# Patient Record
Sex: Female | Born: 1980 | Race: White | Hispanic: No | Smoking: Never smoker
Health system: Southern US, Community
[De-identification: ages and names within clinical notes are randomized; demographics above are authoritative.]

## PROBLEM LIST (undated history)

## (undated) DIAGNOSIS — I456 Pre-excitation syndrome: Secondary | ICD-10-CM

## (undated) DIAGNOSIS — D649 Anemia, unspecified: Secondary | ICD-10-CM

## (undated) HISTORY — PX: ABLATION: SHX5711

---

## 2015-06-22 ENCOUNTER — Encounter (HOSPITAL_COMMUNITY): Payer: Self-pay | Admitting: Emergency Medicine

## 2015-06-22 ENCOUNTER — Emergency Department (HOSPITAL_COMMUNITY): Payer: Self-pay

## 2015-06-22 ENCOUNTER — Emergency Department (HOSPITAL_COMMUNITY)
Admission: EM | Admit: 2015-06-22 | Discharge: 2015-06-22 | Disposition: A | Discharge status: Home / Self Care | Payer: Self-pay | Attending: Emergency Medicine | Admitting: Emergency Medicine

## 2015-06-22 DIAGNOSIS — R0600 Dyspnea, unspecified: Secondary | ICD-10-CM | POA: Insufficient documentation

## 2015-06-22 DIAGNOSIS — Z8669 Personal history of other diseases of the nervous system and sense organs: Secondary | ICD-10-CM | POA: Insufficient documentation

## 2015-06-22 DIAGNOSIS — Z862 Personal history of diseases of the blood and blood-forming organs and certain disorders involving the immune mechanism: Secondary | ICD-10-CM | POA: Insufficient documentation

## 2015-06-22 DIAGNOSIS — R5383 Other fatigue: Secondary | ICD-10-CM | POA: Insufficient documentation

## 2015-06-22 DIAGNOSIS — R1033 Periumbilical pain: Secondary | ICD-10-CM | POA: Insufficient documentation

## 2015-06-22 DIAGNOSIS — R1013 Epigastric pain: Secondary | ICD-10-CM | POA: Insufficient documentation

## 2015-06-22 DIAGNOSIS — N738 Other specified female pelvic inflammatory diseases: Secondary | ICD-10-CM | POA: Insufficient documentation

## 2015-06-22 HISTORY — DX: Pre-excitation syndrome: I45.6

## 2015-06-22 HISTORY — DX: Anemia, unspecified: D64.9

## 2015-06-22 LAB — BASIC METABOLIC PANEL
ANION GAP: 10 (ref 5–15)
BUN: 18 mg/dL (ref 6–20)
CALCIUM: 9.5 mg/dL (ref 8.9–10.3)
CHLORIDE: 106 mmol/L (ref 101–111)
CO2: 25 mmol/L (ref 22–32)
Creatinine, Ser: 0.75 mg/dL (ref 0.44–1.00)
GFR calc non Af Amer: >60 mL/min (ref >60)
Glucose, Bld: 71 mg/dL (ref 65–99)
Potassium: 4 mmol/L (ref 3.5–5.1)
Sodium: 141 mmol/L (ref 135–145)

## 2015-06-22 LAB — CBC
HCT: 38 % (ref 36.0–46.0)
HEMOGLOBIN: 12.6 g/dL (ref 12.0–15.0)
MCH: 29.6 pg (ref 26.0–34.0)
MCHC: 33.2 g/dL (ref 30.0–36.0)
MCV: 89.4 fL (ref 78.0–100.0)
Platelets: 168 10*3/uL (ref 150–400)
RBC: 4.25 MIL/uL (ref 3.87–5.11)
RDW: 14.1 % (ref 11.5–15.5)
WBC: 8 10*3/uL (ref 4.0–10.5)

## 2015-06-22 LAB — HEPATIC FUNCTION PANEL
ALBUMIN: 4 g/dL (ref 3.5–5.0)
ALT: 18 U/L (ref 14–54)
AST: 25 U/L (ref 15–41)
Alkaline Phosphatase: 45 U/L (ref 38–126)
Bilirubin, Direct: <0.1 mg/dL — ABNORMAL LOW (ref 0.1–0.5)
TOTAL PROTEIN: 7.1 g/dL (ref 6.5–8.1)
Total Bilirubin: 0.3 mg/dL (ref 0.3–1.2)

## 2015-06-22 LAB — LIPASE, BLOOD: LIPASE: 44 U/L (ref 11–51)

## 2015-06-22 LAB — I-STAT TROPONIN, ED: TROPONIN I, POC: 0 ng/mL (ref 0.00–0.08)

## 2015-06-22 MED ORDER — FAMOTIDINE 20 MG PO TABS: 20.0000 mg | ORAL_TABLET | Freq: Two times a day (BID) | ORAL | Status: AC

## 2015-06-22 MED ORDER — FAMOTIDINE 20 MG PO TABS
40.0000 mg | ORAL_TABLET | Freq: Once | ORAL | Status: AC
Start: 2015-06-22 — End: 2015-06-22
  Administered 2015-06-22: 40 mg via ORAL
  Filled 2015-06-22: qty 2

## 2015-06-22 MED ORDER — SUCRALFATE 1 G PO TABS: 1.0000 g | ORAL_TABLET | Freq: Four times a day (QID) | ORAL | Status: AC

## 2015-06-22 MED ORDER — SUCRALFATE 1 G PO TABS
1.0000 g | ORAL_TABLET | Freq: Once | ORAL | Status: AC
  Administered 2015-06-22: 1 g via ORAL
  Filled 2015-06-22: qty 1

## 2015-06-22 MED ORDER — ONDANSETRON HCL 4 MG/2ML IJ SOLN
4.0000 mg | Freq: Once | INTRAMUSCULAR | Status: AC
  Administered 2015-06-22: 4 mg via INTRAVENOUS
  Filled 2015-06-22: qty 2

## 2015-06-22 MED ORDER — GI COCKTAIL ~~LOC~~
30.0000 mL | Freq: Once | ORAL | Status: AC
  Administered 2015-06-22: 30 mL via ORAL
  Filled 2015-06-22: qty 30

## 2015-06-22 NOTE — ED Notes (Addendum)
Pt reports sudden onset N, fatigue, back pain, SOB with heaviness to central chest.  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  Pt denies recent illness.  EMS reports giving 324 ASA, pt reports pain improved after ASA.  Pain 5/10

## 2015-06-22 NOTE — ED Provider Notes (Signed)
CSN: [id number]     Arrival date & time 06/22/15  0907 History   First MD Initiated Contact with Patient 06/22/15 [phone]     Chief Complaint  Patient presents with  . Chest Pain     (Consider location/radiation/quality/duration/timing/severity/associated sxs/prior Treatment) HPI Comments: Patient here complaining of sudden onset of epigastric pain as well as the pubic discomfort which began while driving to work. Described as a crampy sensation was then radiates to her chest and was not associated with diaphoresis but did have some dyspnea. Has some associated fatigue. Denies any syncope or nursing a baby.. Denies any urinary symptoms. No vaginal bleeding or discharge. Denies any recent cough or congestion. Patient called EMS was given aspirin which did improve her symptoms. She was transported here. Denies any prior history of same. Does have a remote history of cardiac ablation 19 years ago for WPW  Patient is a 35 y.o. female presenting with chest pain. The history is provided by the patient.  Chest Pain   Past Medical History  Diagnosis Date  . Wolff-Parkinson-White syndrome   . Anemia    Past Surgical History  Procedure Laterality Date  . Ablation    . Cesarean section     No family history on file. Social History  Substance Use Topics  . Smoking status: Never Smoker   . Smokeless tobacco: Never Used  . Alcohol Use: No   OB History    No data available     Review of Systems  Cardiovascular: Positive for chest pain.  All other systems reviewed and are negative.     Allergies  Demerol and Morphine and related  Home Medications   Prior to Admission medications   Not on File   BP 110/68 mmHg  Pulse 78  Temp(Src) 97.6 F (36.4 C) (Oral)  Resp 20  Ht  (1.6 m)  Wt 61.236 kg  BMI 23.92 kg/m2  SpO2 100%  LMP 06/01/2015 (Approximate) Physical Exam  Constitutional: She is oriented to person, place, and time. She appears well-developed and well-nourished.   Non-toxic appearance. No distress.  HENT:  Head: Normocephalic and atraumatic.  Eyes: Conjunctivae, EOM and lids are normal. Pupils are equal, round, and reactive to light.  Neck: Normal range of motion. Neck supple. No tracheal deviation present. No thyroid mass present.  Cardiovascular: Normal rate, regular rhythm and normal heart sounds.  Exam reveals no gallop.   No murmur heard. Pulmonary/Chest: Effort normal and breath sounds normal. No stridor. No respiratory distress. She has no decreased breath sounds. She has no wheezes. She has no rhonchi. She has no rales.  Abdominal: Soft. Normal appearance and bowel sounds are normal. She exhibits no distension. There is tenderness in the epigastric area and periumbilical area. There is no rebound and no CVA tenderness.    Musculoskeletal: Normal range of motion. She exhibits no edema or tenderness.  Neurological: She is alert and oriented to person, place, and time. She has normal strength. No cranial nerve deficit or sensory deficit. GCS eye subscore is 4. GCS verbal subscore is 5. GCS motor subscore is 6.  Skin: Skin is warm and dry. No abrasion and no rash noted.  Psychiatric: She has a normal mood and affect. Her speech is normal and behavior is normal.  Nursing note and vitals reviewed.   ED Course  Procedures (including critical care time) Labs Review Labs Reviewed  BASIC METABOLIC PANEL  CBC  I-STAT TROPOININ, ED    Imaging Review No results found. I  have personally reviewed and evaluated these images and lab results as part of my medical decision-making.   EKG Interpretation None      MDM   Final diagnoses:  None    ED ECG REPORT   Date: 06/22/2015  Rate: 60  Rhythm: normal sinus rhythm  QRS Axis: normal  Intervals: normal  ST/T Wave abnormalities: normal  Conduction Disutrbances:none  Narrative Interpretation:   Old EKG Reviewed: none available  I have personally reviewed the EKG tracing and agree with the  computerized printout as noted.   Patient given medications for reflux and feels better. Will place on Carafate and Pepcid and discharged home  Lorre Nick, MD 06/22/15 1322

## 2015-06-22 NOTE — ED Notes (Signed)
Pt states "I have no energy -- feels like a tightness in my chest that I can't get a big breath"

## 2015-06-22 NOTE — ED Notes (Signed)
Pt states she went to zumba this am, had no difficulty, went to [place] kids this weekend and felt fine after. States that it feels heavy on chest.

## 2015-06-22 NOTE — Discharge Instructions (Signed)
Abdominal Pain, Adult °Many things can cause abdominal pain. Usually, abdominal pain is not caused by a disease and will improve without treatment. It can often be observed and treated at home. Your health care provider will do a physical exam and possibly order blood tests and X-rays to help determine the seriousness of your pain. However, in many cases, more time must pass before a clear cause of the pain can be found. Before that point, your health care provider may not know if you need more testing or further treatment. °HOME CARE INSTRUCTIONS °Monitor your abdominal pain for any changes. The following actions may help to alleviate any discomfort you are experiencing: °· Only take over-the-counter or prescription medicines as directed by your health care provider. °· Do not take laxatives unless directed to do so by your health care provider. °· Try a clear liquid diet (broth, tea, or water) as directed by your health care provider. Slowly move to a bland diet as tolerated. °SEEK MEDICAL CARE IF: °· You have unexplained abdominal pain. °· You have abdominal pain associated with nausea or diarrhea. °· You have pain when you urinate or have a bowel movement. °· You experience abdominal pain that wakes you in the night. °· You have abdominal pain that is worsened or improved by eating food. °· You have abdominal pain that is worsened with eating fatty foods. °· You have a fever. °SEEK IMMEDIATE MEDICAL CARE IF: °· Your pain does not go away within 2 hours. °· You keep throwing up (vomiting). °· Your pain is felt only in portions of the abdomen, such as the right side or the left lower portion of the abdomen. °· You pass bloody or black tarry stools. °MAKE SURE YOU: °· Understand these instructions. °· Will watch your condition. °· Will get help right away if you are not doing well or get worse. °  °This information is not intended to replace advice given to you by your health care provider. Make sure you discuss  any questions you have with your health care provider. °  °Document Released: 01/26/2005 Document Revised: 01/07/2015 Document Reviewed: 12/26/2012 °Elsevier Interactive Patient Education ©2016 [employer] °Food Choices for Gastroesophageal Reflux Disease, Adult °When you have gastroesophageal reflux disease (GERD), the foods you eat and your eating habits are very important. Choosing the right foods can help ease the discomfort of GERD. °WHAT GENERAL GUIDELINES DO I NEED TO FOLLOW? °· Choose fruits, vegetables, whole grains, low-fat dairy products, and low-fat meat, fish, and poultry. °· Limit fats such as oils, salad dressings, butter, nuts, and avocado. °· Keep a food diary to identify foods that cause symptoms. °· Avoid foods that cause reflux. These may be different for different people. °· Eat frequent small meals instead of three large meals each day. °· Eat your meals slowly, in a relaxed setting. °· Limit fried foods. °· Cook foods using methods other than frying. °· Avoid drinking alcohol. °· Avoid drinking large amounts of liquids with your meals. °· Avoid bending over or lying down until 2-3 hours after eating. °WHAT FOODS ARE NOT RECOMMENDED? °The following are some foods and drinks that may worsen your symptoms: °Vegetables °Tomatoes. Tomato juice. Tomato and spaghetti sauce. Chili peppers. Onion and garlic. Horseradish. °Fruits °Oranges, grapefruit, and lemon (fruit and juice). °Meats °High-fat meats, fish, and poultry. This includes hot dogs, ribs, ham, sausage, salami, and bacon. °Dairy °Whole milk and chocolate milk. Sour cream. Cream. Butter. Ice cream. Cream cheese.  °Beverages °Coffee and tea, with   or without caffeine. Carbonated beverages or energy drinks. °Condiments °Hot sauce. Barbecue sauce.  °Sweets/Desserts °Chocolate and cocoa. Donuts. Peppermint and spearmint. °Fats and Oils °High-fat foods, including French fries and potato chips. °Other °Vinegar. Strong spices, such as black pepper,  white pepper, red pepper, cayenne, curry powder, cloves, ginger, and chili powder. °The items listed above may not be a complete list of foods and beverages to avoid. Contact your dietitian for more information. °  °This information is not intended to replace advice given to you by your health care provider. Make sure you discuss any questions you have with your health care provider. °  °Document Released: 04/18/2005 Document Revised: 05/09/2014 Document Reviewed: 02/20/2013 °Elsevier Interactive Patient Education ©2016 [employer]vier Inc. ° °

## 2018-11-01 ENCOUNTER — Encounter (HOSPITAL_COMMUNITY): Payer: Self-pay | Admitting: Emergency Medicine

## 2018-11-01 ENCOUNTER — Other Ambulatory Visit: Payer: Self-pay

## 2018-11-01 DIAGNOSIS — Y9389 Activity, other specified: Secondary | ICD-10-CM | POA: Diagnosis not present

## 2018-11-01 DIAGNOSIS — Y999 Unspecified external cause status: Secondary | ICD-10-CM | POA: Diagnosis not present

## 2018-11-01 DIAGNOSIS — T192XXA Foreign body in vulva and vagina, initial encounter: Secondary | ICD-10-CM | POA: Diagnosis not present

## 2018-11-01 DIAGNOSIS — Y9289 Other specified places as the place of occurrence of the external cause: Secondary | ICD-10-CM | POA: Diagnosis not present

## 2018-11-01 DIAGNOSIS — Y658 Other specified misadventures during surgical and medical care: Secondary | ICD-10-CM | POA: Diagnosis not present

## 2018-11-01 DIAGNOSIS — Z5321 Procedure and treatment not carried out due to patient leaving prior to being seen by health care provider: Secondary | ICD-10-CM | POA: Insufficient documentation

## 2018-11-01 DIAGNOSIS — G8918 Other acute postprocedural pain: Secondary | ICD-10-CM | POA: Diagnosis not present

## 2018-11-01 NOTE — ED Triage Notes (Signed)
Yesterday the patient had an IUD removed. Today she experienced heavy cramping. Tylenol and Ibuprofen did not help. Patient believes she found part of her IUD in the toilet today.

## 2018-11-02 ENCOUNTER — Emergency Department (HOSPITAL_COMMUNITY)
Admission: EM | Admit: 2018-11-02 | Discharge: 2018-11-02 | Discharge status: Against Medical Advice | Payer: Medicaid Other | Source: Home / Self Care

## 2018-11-02 ENCOUNTER — Other Ambulatory Visit: Payer: Self-pay

## 2018-11-02 ENCOUNTER — Encounter (HOSPITAL_COMMUNITY): Payer: Self-pay

## 2018-11-02 ENCOUNTER — Emergency Department (HOSPITAL_COMMUNITY)
Admission: EM | Admit: 2018-11-02 | Discharge: 2018-11-02 | Disposition: A | Discharge status: Home / Self Care | Payer: Medicaid Other | Attending: Emergency Medicine | Admitting: Emergency Medicine

## 2018-11-02 DIAGNOSIS — Y658 Other specified misadventures during surgical and medical care: Secondary | ICD-10-CM | POA: Insufficient documentation

## 2018-11-02 DIAGNOSIS — Y9389 Activity, other specified: Secondary | ICD-10-CM | POA: Insufficient documentation

## 2018-11-02 DIAGNOSIS — T192XXA Foreign body in vulva and vagina, initial encounter: Secondary | ICD-10-CM

## 2018-11-02 DIAGNOSIS — Y9289 Other specified places as the place of occurrence of the external cause: Secondary | ICD-10-CM | POA: Insufficient documentation

## 2018-11-02 DIAGNOSIS — Y999 Unspecified external cause status: Secondary | ICD-10-CM | POA: Insufficient documentation

## 2018-11-02 DIAGNOSIS — G8918 Other acute postprocedural pain: Secondary | ICD-10-CM | POA: Insufficient documentation

## 2018-11-02 LAB — BASIC METABOLIC PANEL
Anion gap: 8 (ref 5–15)
BUN: 12 mg/dL (ref 6–20)
CO2: 28 mmol/L (ref 22–32)
Calcium: 9.2 mg/dL (ref 8.9–10.3)
Chloride: 104 mmol/L (ref 98–111)
Creatinine, Ser: 0.73 mg/dL (ref 0.44–1.00)
GFR calc Af Amer: >60 mL/min (ref >60)
GFR calc non Af Amer: >60 mL/min (ref >60)
Glucose, Bld: 95 mg/dL (ref 70–99)
Potassium: 4.4 mmol/L (ref 3.5–5.1)
Sodium: 140 mmol/L (ref 135–145)

## 2018-11-02 LAB — CBC WITH DIFFERENTIAL/PLATELET
Abs Immature Granulocytes: 0.02 10*3/uL (ref 0.00–0.07)
Basophils Absolute: 0 10*3/uL (ref 0.0–0.1)
Basophils Relative: 0 %
Eosinophils Absolute: 0.3 10*3/uL (ref 0.0–0.5)
Eosinophils Relative: 4 %
HCT: 38.5 % (ref 36.0–46.0)
Hemoglobin: 12.2 g/dL (ref 12.0–15.0)
Immature Granulocytes: 0 %
Lymphocytes Relative: 31 %
Lymphs Abs: 2.1 10*3/uL (ref 0.7–4.0)
MCH: 29.5 pg (ref 26.0–34.0)
MCHC: 31.7 g/dL (ref 30.0–36.0)
MCV: 93.2 fL (ref 80.0–100.0)
Monocytes Absolute: 0.5 10*3/uL (ref 0.1–1.0)
Monocytes Relative: 7 %
Neutro Abs: 3.9 10*3/uL (ref 1.7–7.7)
Neutrophils Relative %: 58 %
Platelets: 202 10*3/uL (ref 150–400)
RBC: 4.13 MIL/uL (ref 3.87–5.11)
RDW: 13.2 % (ref 11.5–15.5)
WBC: 6.7 10*3/uL (ref 4.0–10.5)
nRBC: 0 % (ref 0.0–0.2)

## 2018-11-02 LAB — WET PREP, GENITAL
Sperm: NONE SEEN
Trich, Wet Prep: NONE SEEN
Yeast Wet Prep HPF POC: NONE SEEN

## 2018-11-02 NOTE — Discharge Instructions (Signed)
Continue treating your pain as directed by your gynecologist. Schedule an appointment to follow-up with them on Monday. Return to the ER if you develop fever, significant vaginal bleeding, or new or concerning symptoms.

## 2018-11-02 NOTE — ED Notes (Signed)
Bed: WA13 Expected date:  Expected time:  Means of arrival:  Comments: triage 

## 2018-11-02 NOTE — ED Notes (Signed)
Pt ambulatory to restroom

## 2018-11-02 NOTE — ED Triage Notes (Signed)
Patient had her IUD removed 2 days ago. Patient reports that she passed a piece of plastic from her vagina last night.  Patient also c/o diarrhea x 4-5 times today.

## 2018-11-02 NOTE — ED Provider Notes (Signed)
[facility]-EMERGENCY DEPT Provider Note   CSN: [id number] Arrival date & time: 11/02/18  1110    History   Chief Complaint Chief Complaint  Patient presents with  . foreign object from vagina    HPI Gwendolyn Price is a 38 y.o. female w PMHx WPW, recent hysteroscopy for copper IUD removal and polypectomy,  presenting to the ED with complaint of abdominal cramping. Pt had procedure done on Wednesday by [facility], for imbedded IUD. Yesterday she developed menstrual-like cramping. Sx improved with tylenol today. She states she also passed a piece of plastic from her vagina, and presents it on evaluation. She is concerned for retained foreign bodies in her vagina. She has had some vaginal bleeding, though not more than a period. This morning she developed diarrhea. Per chart review, she called her Gyn clinic yesterday who recommended she save the piece of plastic and f/u outpatient on 7/6. She denies assoc n/v, urinary sx, fever, or other assoc symptoms.     The history is provided by the patient and medical records.    Past Medical History:  Diagnosis Date  . Anemia   . Wolff-Parkinson-White syndrome     There are no active problems to display for this patient.   Past Surgical History:  Procedure Laterality Date  . ABLATION    . CESAREAN SECTION       OB History   No obstetric history on file.      Home Medications    Prior to Admission medications   Medication Sig Start Date End Date Taking? Authorizing Provider  acetaminophen (TYLENOL) 500 MG tablet Take 500 mg by mouth every 6 (six) hours as needed for mild pain.   Yes [provider]  ibuprofen (ADVIL) 800 MG tablet Take 800 mg by mouth every 8 (eight) hours as needed for mild pain.   Yes [provider]  famotidine (PEPCID) 20 MG tablet Take 1 tablet (20 mg total) by mouth 2 (two) times daily. Patient not taking: Reported on 11/02/2018 06/22/15   Lorre NickAllen, Anthony, MD  sucralfate  (CARAFATE) 1 g tablet Take 1 tablet (1 g total) by mouth 4 (four) times daily. Patient not taking: Reported on 11/02/2018 06/22/15   Lorre NickAllen, Anthony, MD    Family History Family History  Problem Relation Age of Onset  . Rheum arthritis Mother   . Cancer Mother   . Hypertension Father   . High Cholesterol Father     Social History Social History   Tobacco Use  . Smoking status: Never Smoker  . Smokeless tobacco: Never Used  Substance Use Topics  . Alcohol use: No  . Drug use: No     Allergies   Demerol [meperidine] and Morphine and related   Review of Systems Review of Systems  Constitutional: Negative for fever.  Gastrointestinal: Positive for abdominal pain. Negative for nausea and vomiting.  Genitourinary: Positive for vaginal bleeding.  All other systems reviewed and are negative.    Physical Exam Updated Vital Signs BP 108/71   Pulse 71   Temp 98.4 F (36.9 C) (Oral)   Resp 16   Ht 5\' 3"  (1.6 m)   Wt 76 kg   LMP 11/02/2018   SpO2 100%   BMI 29.70 kg/m   Physical Exam Vitals signs and nursing note reviewed. Exam conducted with a chaperone present.  Constitutional:      General: She is not in acute distress.    Appearance: She is well-developed. She is not ill-appearing.  HENT:     Head: Normocephalic and atraumatic.  Eyes:     Conjunctiva/sclera: Conjunctivae normal.  Cardiovascular:     Rate and Rhythm: Normal rate and regular rhythm.  Pulmonary:     Effort: Pulmonary effort is normal. No respiratory distress.     Breath sounds: Normal breath sounds.  Abdominal:     General: Bowel sounds are normal.     Palpations: Abdomen is soft.     Tenderness: There is abdominal tenderness in the suprapubic area. There is no guarding or rebound.  Genitourinary:    Labia:        Right: No rash or tenderness.        Left: No rash or tenderness.      Cervix: Cervical bleeding present.     Comments: There is mild amount of dark red blood present from the  cervical os, no pus or abnormal discharge.  The uterus does feel somewhat firm with some tenderness, however no bogginess.  No foreign bodies visualized or palpated Skin:    General: Skin is warm.  Neurological:     Mental Status: She is alert.  Psychiatric:        Behavior: Behavior normal.      ED Treatments / Results  Labs (all labs ordered are listed, but only abnormal results are displayed) Labs Reviewed  WET PREP, GENITAL - Abnormal; Notable for the following components:      Result Value   Clue Cells Wet Prep HPF POC PRESENT (*)    WBC, Wet Prep HPF POC FEW (*)    All other components within normal limits  CBC WITH DIFFERENTIAL/PLATELET  BASIC METABOLIC PANEL    EKG None  Radiology No results found.  Procedures Procedures (including critical care time)  Medications Ordered in ED Medications - No data to display   Initial Impression / Assessment and Plan / ED Course  I have reviewed the triage vital signs and the nursing notes.  Pertinent labs & imaging results that were available during my care of the patient were reviewed by me and considered in my medical decision making (see chart for details).       Patient presenting with concern for foreign object that came out of her vagina yesterday after a hysteroscopy for movable of embedded IUD on Wednesday at [facility] as well as polypectomy.  She has been having some pelvic cramping and small amount of vaginal bleeding, however is most concerned about the piece of plastic that came out of her vagina.  She called her gynecologist office who performed the procedure and reported no concerns otherwise to follow-up on Monday and to bring the piece of plastic with her.  She is is concerned for retained foreign body.  No fever, nausea, vomiting, or other associated symptoms.  On pelvic exam there is no purulence noted.  There is some tenderness with palpation of the uterus. No obvious retained foreign bodies on exam. Wet prep  with few white cells.  CBC with normal white count, normal metabolic panel.  Patient is afebrile with normal vital signs.  Low suspicion for postop infection.  Discussed with Dr. Lockie Molauratolo.  At this time we recommend discharge with outpatient follow-up with gynecology.  Continue treating symptoms.  Return for fever or significant vaginal bleeding.  Patient agreeable to plan and safe for discharge.  Discussed results, findings, treatment and follow up. Patient advised of return precautions. Patient verbalized understanding and agreed with plan.  Final Clinical Impressions(s) / ED Diagnoses  Final diagnoses:  Post-operative pain  Foreign body in vagina, initial encounter    ED Discharge Orders    None       , [place] N, PA-C 11/02/18 [place], [place], DO 11/02/18 1903

## 2018-11-02 NOTE — ED Notes (Signed)
Pt refused d/c vitals.

## 2019-05-01 ENCOUNTER — Ambulatory Visit: Payer: Medicaid Other | Attending: Internal Medicine

## 2019-05-01 DIAGNOSIS — Z20822 Contact with and (suspected) exposure to covid-19: Secondary | ICD-10-CM

## 2019-05-02 LAB — NOVEL CORONAVIRUS, NAA: SARS-CoV-2, NAA: NOT DETECTED

## 2020-11-11 ENCOUNTER — Other Ambulatory Visit: Payer: Self-pay | Admitting: Physician Assistant

## 2020-11-11 DIAGNOSIS — Z1231 Encounter for screening mammogram for malignant neoplasm of breast: Secondary | ICD-10-CM

## 2020-11-18 ENCOUNTER — Other Ambulatory Visit: Payer: Self-pay

## 2020-11-18 ENCOUNTER — Ambulatory Visit
Admission: RE | Admit: 2020-11-18 | Discharge: 2020-11-18 | Disposition: A | Discharge status: Home / Self Care | Payer: Medicaid Other | Source: Ambulatory Visit | Attending: Physician Assistant | Admitting: Physician Assistant

## 2020-11-18 DIAGNOSIS — Z1231 Encounter for screening mammogram for malignant neoplasm of breast: Secondary | ICD-10-CM

## 2022-05-07 IMAGING — MG MM DIGITAL SCREENING BILAT W/ TOMO AND CAD
8 series · 9 of 24 positions shown · non-contrast
Comparison: Previous exam(s).

CLINICAL DATA: Screening.

EXAM:
DIGITAL SCREENING BILATERAL MAMMOGRAM WITH TOMOSYNTHESIS AND CAD
TECHNIQUE: Bilateral screening digital craniocaudal and mediolateral oblique
mammograms were obtained. Bilateral screening digital breast
tomosynthesis was performed. The images were evaluated with
computer-aided detection.

[R CC synth-2D]
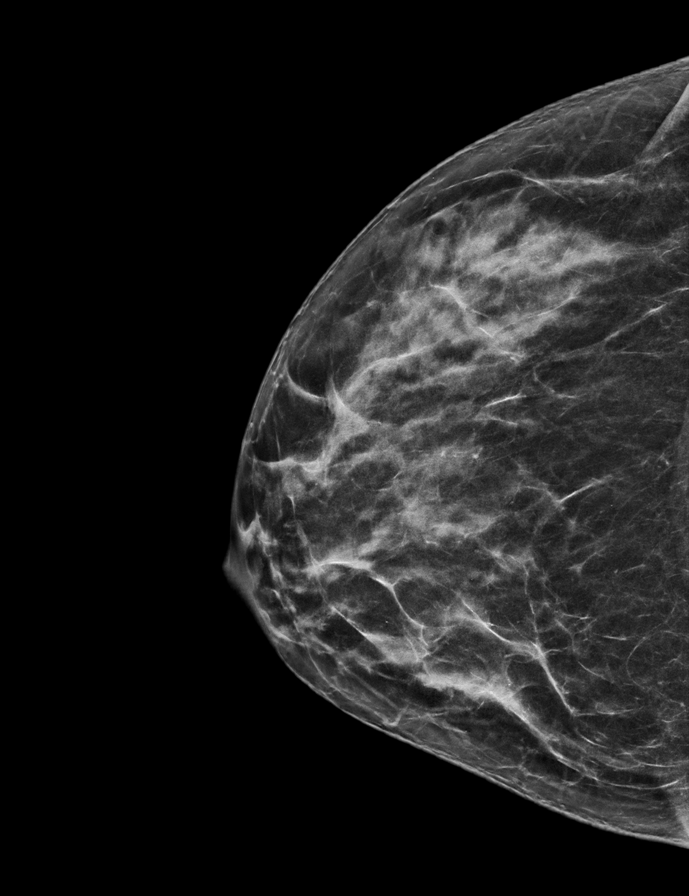

[L MLO synth-2D]
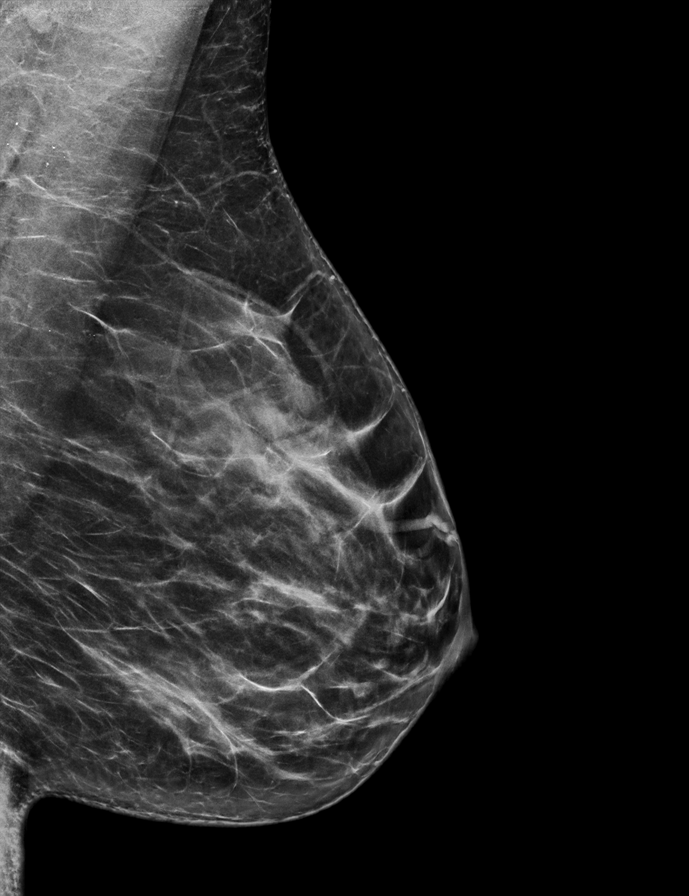

[R MLO synth-2D]
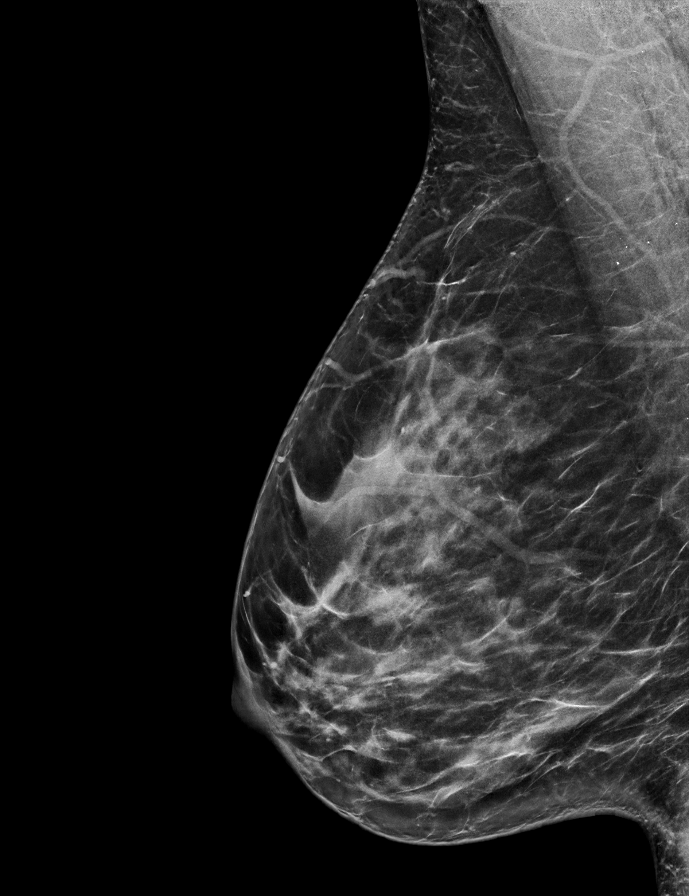

[L CC synth-2D]
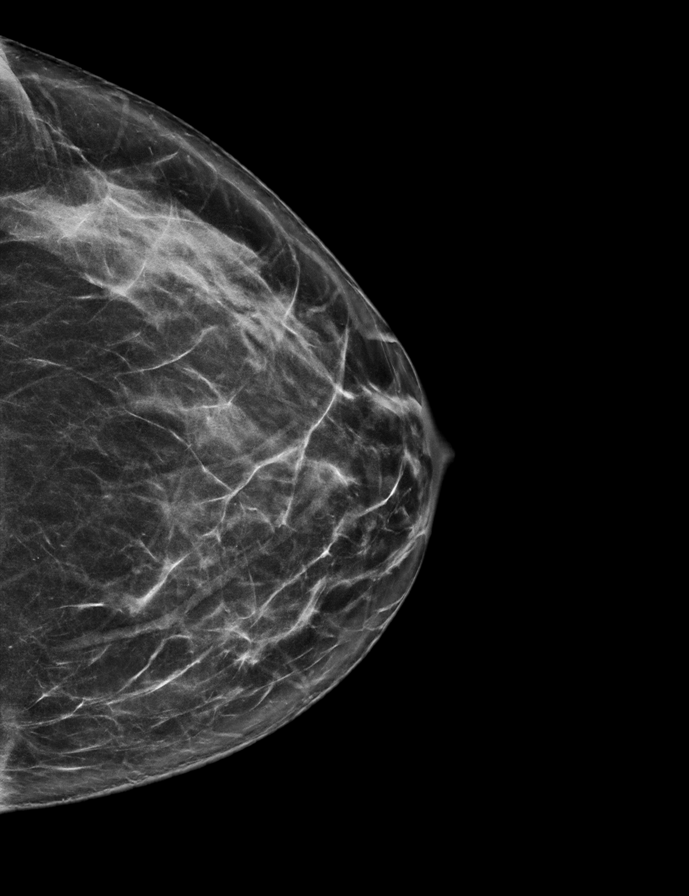

[L MLO tomo · 2 of 68 frames shown]
[frame 22/68]
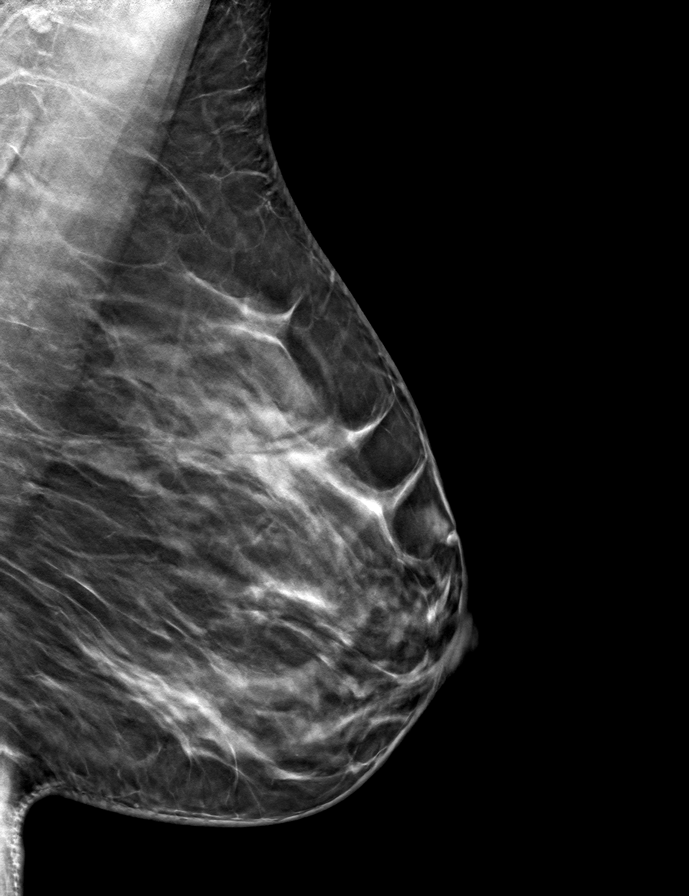
[frame 35/68]
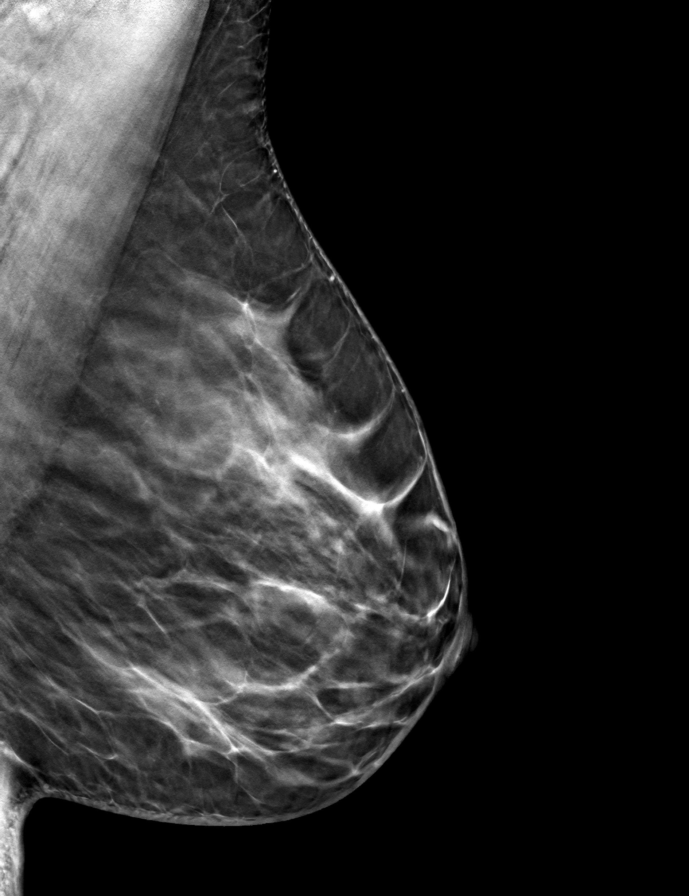

[L CC tomo · tomo slice 33/64.0]
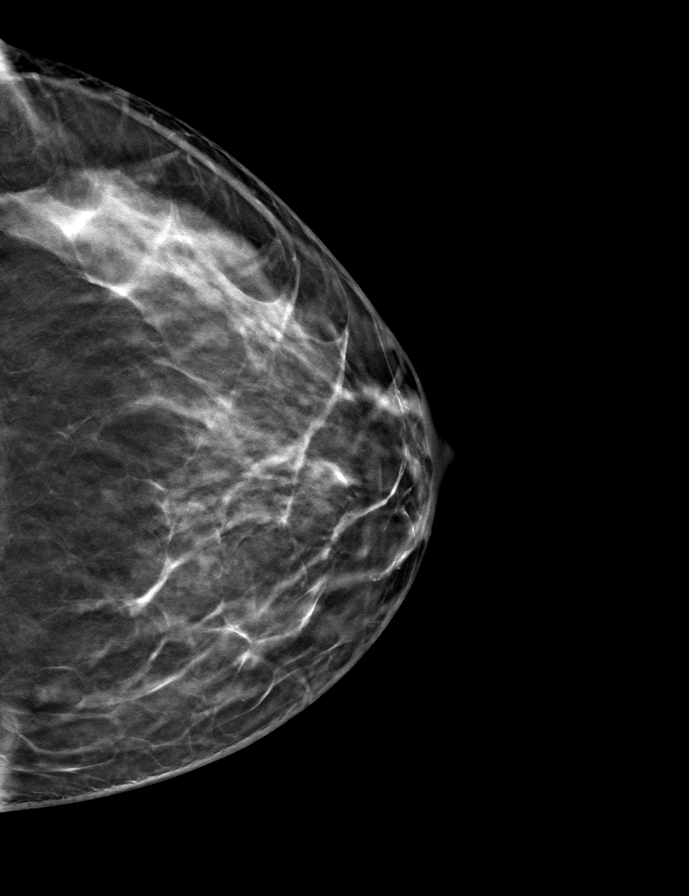

[R CC tomo · tomo slice 29/58.0]
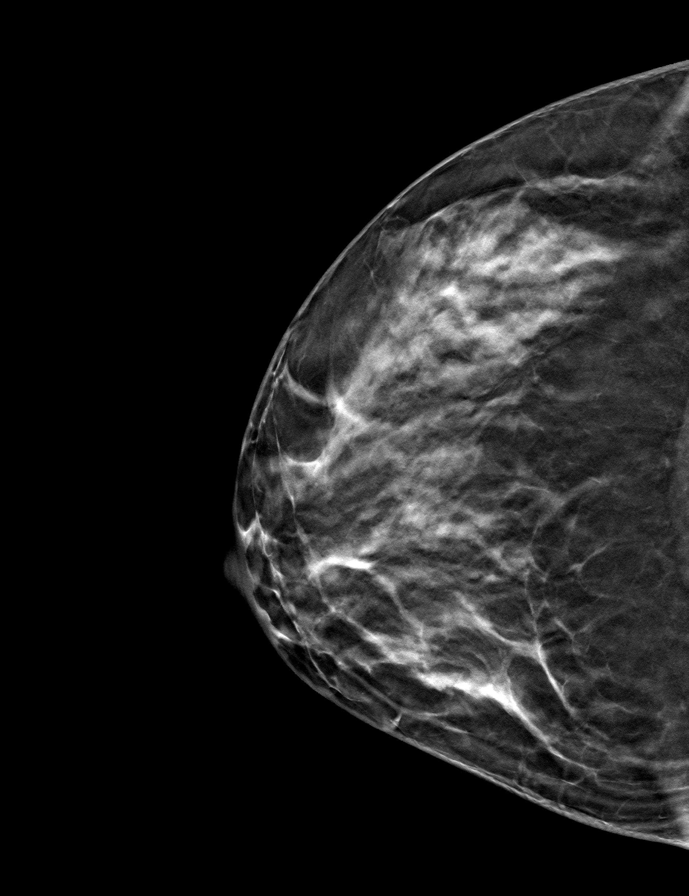

[R MLO tomo · tomo slice 33/65.0]
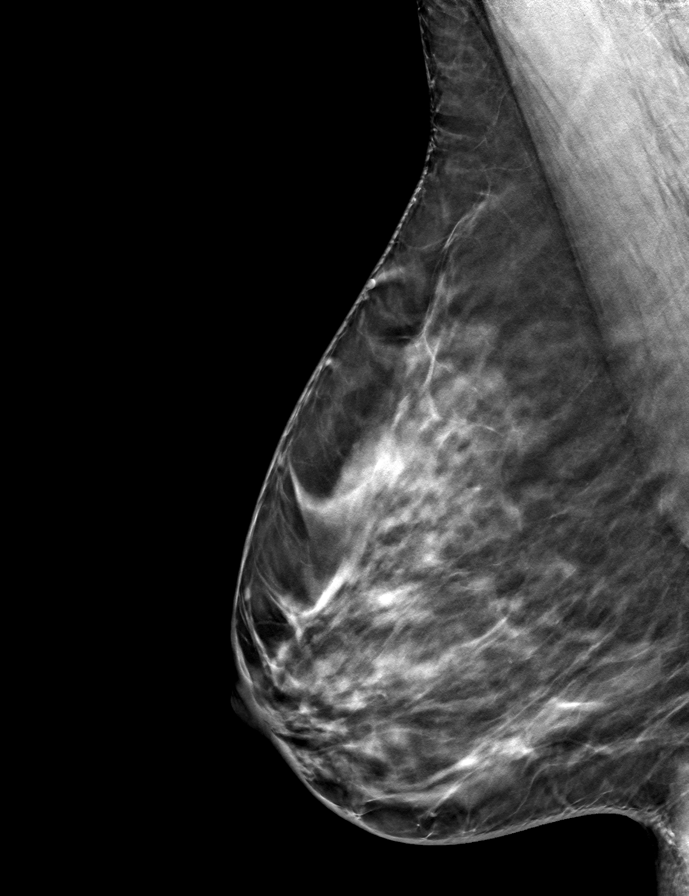

[9 of 24 positions shown; findings below may reference images not displayed]

ACR Breast Density Category c: The breast tissue is heterogeneously
dense, which may obscure small masses.
FINDINGS: There are no findings suspicious for malignancy.
IMPRESSION: No mammographic evidence of malignancy. A result letter of this
screening mammogram will be mailed directly to the patient.

RECOMMENDATION:
Screening mammogram in one year. (Code:Q3-W-BC3)

BI-RADS CATEGORY  1: Negative.

## 2022-11-08 ENCOUNTER — Encounter (HOSPITAL_COMMUNITY): Payer: Self-pay

## 2022-11-08 ENCOUNTER — Emergency Department (HOSPITAL_COMMUNITY): Payer: Medicaid Other

## 2022-11-08 ENCOUNTER — Emergency Department (HOSPITAL_COMMUNITY)
Admission: EM | Admit: 2022-11-08 | Discharge: 2022-11-08 | Disposition: A | Discharge status: Home / Self Care | Payer: Medicaid Other | Attending: Emergency Medicine | Admitting: Emergency Medicine

## 2022-11-08 DIAGNOSIS — R42 Dizziness and giddiness: Secondary | ICD-10-CM | POA: Insufficient documentation

## 2022-11-08 DIAGNOSIS — R531 Weakness: Secondary | ICD-10-CM | POA: Insufficient documentation

## 2022-11-08 LAB — PROTIME-INR
INR: 1 (ref 0.8–1.2)
Prothrombin Time: 13.1 seconds (ref 11.4–15.2)

## 2022-11-08 LAB — I-STAT CHEM 8, ED
BUN: 12 mg/dL (ref 6–20)
Calcium, Ion: 1.09 mmol/L — ABNORMAL LOW (ref 1.15–1.40)
Chloride: 107 mmol/L (ref 98–111)
Creatinine, Ser: 0.7 mg/dL (ref 0.44–1.00)
Glucose, Bld: 97 mg/dL (ref 70–99)
HCT: 33 % — ABNORMAL LOW (ref 36.0–46.0)
Hemoglobin: 11.2 g/dL — ABNORMAL LOW (ref 12.0–15.0)
Potassium: 4.3 mmol/L (ref 3.5–5.1)
Sodium: 142 mmol/L (ref 135–145)
TCO2: 25 mmol/L (ref 22–32)

## 2022-11-08 LAB — DIFFERENTIAL
Abs Immature Granulocytes: 0.02 10*3/uL (ref 0.00–0.07)
Basophils Absolute: 0 10*3/uL (ref 0.0–0.1)
Basophils Relative: 1 %
Eosinophils Absolute: 0.2 10*3/uL (ref 0.0–0.5)
Eosinophils Relative: 4 %
Immature Granulocytes: 0 %
Lymphocytes Relative: 36 %
Lymphs Abs: 2.2 10*3/uL (ref 0.7–4.0)
Monocytes Absolute: 0.4 10*3/uL (ref 0.1–1.0)
Monocytes Relative: 7 %
Neutro Abs: 3.3 10*3/uL (ref 1.7–7.7)
Neutrophils Relative %: 52 %

## 2022-11-08 LAB — CBC
HCT: 39.5 % (ref 36.0–46.0)
Hemoglobin: 12.8 g/dL (ref 12.0–15.0)
MCH: 28.6 pg (ref 26.0–34.0)
MCHC: 32.4 g/dL (ref 30.0–36.0)
MCV: 88.4 fL (ref 80.0–100.0)
Platelets: 190 10*3/uL (ref 150–400)
RBC: 4.47 MIL/uL (ref 3.87–5.11)
RDW: 12.7 % (ref 11.5–15.5)
WBC: 6.2 10*3/uL (ref 4.0–10.5)
nRBC: 0 % (ref 0.0–0.2)

## 2022-11-08 LAB — TROPONIN I (HIGH SENSITIVITY)
Troponin I (High Sensitivity): <2 ng/L (ref <18)
Troponin I (High Sensitivity): <2 ng/L (ref <18)

## 2022-11-08 LAB — RAPID URINE DRUG SCREEN, HOSP PERFORMED
Amphetamines: NOT DETECTED
Barbiturates: NOT DETECTED
Benzodiazepines: NOT DETECTED
Cocaine: NOT DETECTED
Opiates: NOT DETECTED
Tetrahydrocannabinol: NOT DETECTED

## 2022-11-08 LAB — APTT: aPTT: 29 seconds (ref 24–36)

## 2022-11-08 LAB — COMPREHENSIVE METABOLIC PANEL
ALT: 14 U/L (ref 0–44)
AST: 20 U/L (ref 15–41)
Albumin: 3.8 g/dL (ref 3.5–5.0)
Alkaline Phosphatase: 52 U/L (ref 38–126)
Anion gap: 11 (ref 5–15)
BUN: 12 mg/dL (ref 6–20)
CO2: 23 mmol/L (ref 22–32)
Calcium: 9.2 mg/dL (ref 8.9–10.3)
Chloride: 103 mmol/L (ref 98–111)
Creatinine, Ser: 0.83 mg/dL (ref 0.44–1.00)
GFR, Estimated: >60 mL/min (ref >60)
Glucose, Bld: 107 mg/dL — ABNORMAL HIGH (ref 70–99)
Potassium: 4.2 mmol/L (ref 3.5–5.1)
Sodium: 137 mmol/L (ref 135–145)
Total Bilirubin: 0.4 mg/dL (ref 0.3–1.2)
Total Protein: 6.9 g/dL (ref 6.5–8.1)

## 2022-11-08 LAB — URINALYSIS, ROUTINE W REFLEX MICROSCOPIC
Bilirubin Urine: NEGATIVE
Glucose, UA: NEGATIVE mg/dL
Ketones, ur: NEGATIVE mg/dL
Leukocytes,Ua: NEGATIVE
Nitrite: NEGATIVE
Protein, ur: NEGATIVE mg/dL
Specific Gravity, Urine: 1.004 — ABNORMAL LOW (ref 1.005–1.030)
pH: 8 (ref 5.0–8.0)

## 2022-11-08 LAB — ETHANOL: Alcohol, Ethyl (B): <10 mg/dL (ref <10)

## 2022-11-08 LAB — HCG, SERUM, QUALITATIVE: Preg, Serum: NEGATIVE

## 2022-11-08 MED ORDER — SODIUM CHLORIDE 0.9 % IV BOLUS
1000.0000 mL | Freq: Once | INTRAVENOUS | Status: AC
  Administered 2022-11-08: 1000 mL via INTRAVENOUS

## 2022-11-08 MED ORDER — IOHEXOL 350 MG/ML SOLN
75.0000 mL | Freq: Once | INTRAVENOUS | Status: AC | PRN
  Administered 2022-11-08: 75 mL via INTRAVENOUS

## 2022-11-08 MED ORDER — ONDANSETRON HCL 4 MG/2ML IJ SOLN
4.0000 mg | Freq: Once | INTRAMUSCULAR | Status: AC
  Administered 2022-11-08: 4 mg via INTRAVENOUS
  Filled 2022-11-08: qty 2

## 2022-11-08 NOTE — ED Notes (Signed)
Pt ambulated in hall with steady gait.

## 2022-11-08 NOTE — ED Triage Notes (Signed)
Pt woke up at 5am feeling weak, dizzy, shob, nausea.pt reports tingling and numbness in left arm.  Last known well midnight.   Bp 122/80 Hr 80 100% on ra Cbg 107

## 2022-11-08 NOTE — ED Provider Notes (Signed)
Emergency Medicine Provider Triage Evaluation Note  Gwendolyn Price , a 42 y.o. female  was evaluated in triage.  Pt complains of neck pain, left arm paresthesias, sob, weakness/fatigue noticed when she woke up this morning. LKW: midnight.  Review of Systems  Positive: above Negative: Fever, recent illnesses, palpitations  Physical Exam  BP 119/83   Pulse 76   Temp 97.6 F (36.4 C) (Oral)   Resp 15   Ht 5\' 3"  (1.6 m)   Wt 76.2 kg   LMP 10/25/2022 (Approximate)   SpO2 100%   BMI 29.76 kg/m  Gen:   Awake, no distress, laying with eyes closed, slow to speak Resp:  Normal effort  MSK:   Moves extremities without difficulty  Other:  Grip strength slowed but equal, foot plantarflexion and dorsiflexion slowed but equal, facial muscles symmetric, tongue protrudeds midline, EOMI, PERRLA, decreased sensation to light touch on LUE, mental status seems appropriate.   Medical Decision Making  Medically screening exam initiated at 6:27 AM.  Appropriate orders placed.  Gwendolyn Price was informed that the remainder of the evaluation will be completed by another provider, this initial triage assessment does not replace that evaluation, and the importance of remaining in the ED until their evaluation is complete.   Gwendolyn Price, Barbara Cower, MD 11/08/22 [phone]

## 2022-11-08 NOTE — ED Notes (Signed)
Pt ambulated from ed with steady gait. IV removed . Pt verbalized understanding of discharge instructions.

## 2022-11-08 NOTE — Discharge Instructions (Addendum)
You are seen in the emergency department for weakness and dizziness.  Your labs and imaging were all reassuring without any acute abnormalities noted on these.  There is no evidence of a stroke.  I would advise following up with her primary care provider for further evaluation.  If you have any acute worsening symptoms, please return the emergency department.  Given that your symptoms may have been related to possible dehydration, I would encourage you to maintain adequate hydration when you are outdoors.

## 2022-11-08 NOTE — ED Provider Notes (Signed)
[place] EMERGENCY DEPARTMENT AT [facility] Provider Note   CSN: [id number] Arrival date & time: 11/08/22  0601     History Chief Complaint  Patient presents with   Weakness   Dizziness    Gwendolyn Price is a 42 y.o. female.  Patient with past medical history significant for Wolff-Parkinson-White syndrome and anemia presents emergency department complaints of weakness and dizziness.  Patient reports that she has been standing outside in the sun over the last several days and believe that she might become dehydrated.  Endorsing some neck pain as well as left arm tingling.  Denies any significant shortness of breath or chest pain.  Woke up this morning feeling weakness and fatigue which has not improved significantly since rising.  Denies currently take any prescription medications.   Weakness Associated symptoms: dizziness   Dizziness Associated symptoms: weakness        Home Medications Prior to Admission medications   Medication Sig Start Date End Date Taking? Authorizing Provider  acetaminophen (TYLENOL) 500 MG tablet Take 500 mg by mouth every 6 (six) hours as needed for mild pain.    [provider]  famotidine (PEPCID) 20 MG tablet Take 1 tablet (20 mg total) by mouth 2 (two) times daily. Patient not taking: Reported on 11/02/2018 06/22/15   Lorre Nick, MD  ibuprofen (ADVIL) 800 MG tablet Take 800 mg by mouth every 8 (eight) hours as needed for mild pain.    [provider]  sucralfate (CARAFATE) 1 g tablet Take 1 tablet (1 g total) by mouth 4 (four) times daily. Patient not taking: Reported on 11/02/2018 06/22/15   Lorre Nick, MD      Allergies    Demerol [meperidine] and Morphine and codeine    Review of Systems   Review of Systems  Neurological:  Positive for dizziness and weakness.  All other systems reviewed and are negative.   Physical Exam Updated Vital Signs BP 119/83   Pulse 76   Temp 97.6 F (36.4 C) (Oral)   Resp 15    Ht 5\' 3"  (1.6 m)   Wt 76.2 kg   LMP 10/25/2022 (Approximate)   SpO2 100%   BMI 29.76 kg/m  Physical Exam Vitals and nursing note reviewed.  Constitutional:      General: She is not in acute distress.    Appearance: She is well-developed.  HENT:     Head: Normocephalic and atraumatic.  Eyes:     Extraocular Movements: Extraocular movements intact.     Conjunctiva/sclera: Conjunctivae normal.     Pupils: Pupils are equal, round, and reactive to light.  Cardiovascular:     Rate and Rhythm: Normal rate and regular rhythm.     Heart sounds: No murmur heard. Pulmonary:     Effort: Pulmonary effort is normal. No respiratory distress.     Breath sounds: Normal breath sounds.  Abdominal:     Palpations: Abdomen is soft.     Tenderness: There is no abdominal tenderness.  Musculoskeletal:        General: No swelling.     Cervical back: Neck supple.  Skin:    General: Skin is warm and dry.     Capillary Refill: Capillary refill takes less than 2 seconds.  Neurological:     General: No focal deficit present.     Mental Status: She is alert and oriented to person, place, and time. Mental status is at baseline.     Cranial Nerves: No cranial nerve deficit.  Comments: CN II-XII intact. No obvious strength abnormalities in upper and lower extremities when comparing laterally. No slurred speech or facial droop.  Psychiatric:        Mood and Affect: Mood normal.     ED Results / Procedures / Treatments   Labs (all labs ordered are listed, but only abnormal results are displayed) Labs Reviewed  ETHANOL  PROTIME-INR  APTT  CBC  DIFFERENTIAL  COMPREHENSIVE METABOLIC PANEL  RAPID URINE DRUG SCREEN, HOSP PERFORMED  URINALYSIS, ROUTINE W REFLEX MICROSCOPIC  HCG, SERUM, QUALITATIVE  I-STAT CHEM 8, ED  TROPONIN I (HIGH SENSITIVITY)    EKG None  Radiology No results found.  Procedures Procedures   Medications Ordered in ED Medications - No data to display  ED Course/  Medical Decision Making/ A&P                           Medical Decision Making Amount and/or Complexity of Data Reviewed Radiology: ordered.  Risk Prescription drug management.   This patient presents to the ED for concern of weakness, dizziness.  Differential diagnosis includes dehydration, pneumonia, sepsis, viral URI, bronchitis, stroke   Lab Tests:  I Ordered, and personally interpreted labs.  The pertinent results include: CBC, CMP unremarkable, troponin negative, UDS negative, hCG negative, coags normal, ethanol negative, UA without signs of infection and actual mild overhydration with decreased specific gravity   Imaging Studies ordered:  I ordered imaging studies including chest x-ray, CT head, CT cervical spine, CT angio head and neck I independently visualized and interpreted imaging which showed no acute abnormalities noted on any imaging I agree with the radiologist interpretation   Medicines ordered and prescription drug management:  I ordered medication including fluids, Zofran for suspected dehydration, nausea Reevaluation of the patient after these medicines showed that the patient improved I have reviewed the patients home medicines and have made adjustments as needed   Problem List / ED Course:  Patient presented to the emergency department complaints of generalized weakness and dizziness.  She reports that she woke up this morning feeling this way.  Also noticing some left arm tingling.  States that she is typically outdoors for several hours per day and is concerned about possible dehydration although she does feel like she has been drinking sufficient amount of water to replace what she has been losing through sweat.  On initial presentation, patient did not have any concerning vitals and was reassuring on examination.  Tingling on left arm is noted but concerning for possible positional causes as patient reports that she is also having some neck pain.  Final  Clinical Impression(s) / ED Diagnoses Final diagnoses:  None    Rx / DC Orders ED Discharge Orders     None
